# Patient Record
Sex: Male | Born: 1980 | Race: Black or African American | Hispanic: No | Marital: Single | Smoking: Never smoker
Health system: Southern US, Community
[De-identification: ages and names within clinical notes are randomized; demographics above are authoritative.]

---

## 2016-10-13 ENCOUNTER — Emergency Department (HOSPITAL_COMMUNITY)
Admission: EM | Admit: 2016-10-13 | Discharge: 2016-10-14 | Disposition: A | Discharge status: Home / Self Care | Payer: Worker's Compensation | Attending: Emergency Medicine | Admitting: Emergency Medicine

## 2016-10-13 ENCOUNTER — Emergency Department (HOSPITAL_COMMUNITY): Payer: Worker's Compensation

## 2016-10-13 ENCOUNTER — Encounter (HOSPITAL_COMMUNITY): Payer: Self-pay | Admitting: Emergency Medicine

## 2016-10-13 DIAGNOSIS — W208XXA Other cause of strike by thrown, projected or falling object, initial encounter: Secondary | ICD-10-CM | POA: Diagnosis not present

## 2016-10-13 DIAGNOSIS — Y9389 Activity, other specified: Secondary | ICD-10-CM | POA: Diagnosis not present

## 2016-10-13 DIAGNOSIS — S0990XA Unspecified injury of head, initial encounter: Secondary | ICD-10-CM | POA: Diagnosis not present

## 2016-10-13 DIAGNOSIS — Y9289 Other specified places as the place of occurrence of the external cause: Secondary | ICD-10-CM | POA: Insufficient documentation

## 2016-10-13 DIAGNOSIS — Y990 Civilian activity done for income or pay: Secondary | ICD-10-CM | POA: Diagnosis not present

## 2016-10-13 DIAGNOSIS — M542 Cervicalgia: Secondary | ICD-10-CM | POA: Insufficient documentation

## 2016-10-13 NOTE — ED Triage Notes (Signed)
Pt. reported that a box fell on his head while at work this afternoon , denies LOC , reports pain at left lateral neck / headache , pain increases with movement / changing positions . Alert and oriented / respirations unlabored. C- collar applied at triage .

## 2016-10-13 NOTE — ED Notes (Signed)
Patient transported to CT 

## 2016-10-14 ENCOUNTER — Emergency Department (HOSPITAL_COMMUNITY): Payer: Worker's Compensation

## 2016-10-14 MED ORDER — NAPROXEN 500 MG PO TABS: 500.0000 mg | ORAL_TABLET | Freq: Two times a day (BID) | ORAL | 0 refills | Status: DC

## 2016-10-14 NOTE — ED Provider Notes (Signed)
[facility]-EMERGENCY DEPT Provider Note   CSN: [id number] Arrival date & time: 10/13/16  1825     History   Chief Complaint Chief Complaint  Patient presents with  . Neck Pain  . Headache    HPI Chase Adkins is a 36 y.o. male.  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  He thinks it weighed about 45 pounds.  Suddenly developed left neck pain and headache after the incident.  The symptoms have persisted.  He denies paresthesias, weakness, nausea, vomiting, dizziness.  No prior head injuries.  There are no other known modifying factors.     HPI  History reviewed. No pertinent past medical history.  There are no active problems to display for this patient.   History reviewed. No pertinent surgical history.     Home Medications    Prior to Admission medications   Not on File    Family History No family history on file.  Social History Social History  Substance Use Topics  . Smoking status: Never Smoker  . Smokeless tobacco: Never Used  . Alcohol use Yes     Allergies   Patient has no known allergies.   Review of Systems Review of Systems  All other systems reviewed and are negative.    Physical Exam Updated Vital Signs BP 115/78   Pulse (!) 57   Temp 98.6 F (37 C) (Oral)   Resp 20   Ht 6\' 1"  (1.854 m)   Wt 205 lb (93 kg)   SpO2 100%   BMI 27.05 kg/m   Physical Exam  Constitutional: He is oriented to person, place, and time. He appears well-developed and well-nourished.  HENT:  Head: Normocephalic and atraumatic.  Right Ear: External ear normal.  Left Ear: External ear normal.  Eyes: Conjunctivae and EOM are normal. Pupils are equal, round, and reactive to light.  Neck: Normal range of motion and phonation normal. Neck supple.  Cardiovascular: Normal rate, regular rhythm and normal heart sounds.   Pulmonary/Chest: Effort normal and breath sounds normal. He exhibits no bony tenderness.  Abdominal:  Soft. There is no tenderness.  Musculoskeletal: Normal range of motion.  Neurological: He is alert and oriented to person, place, and time. No cranial nerve deficit or sensory deficit. He exhibits normal muscle tone. Coordination normal.  Skin: Skin is warm, dry and intact.  Psychiatric: He has a normal mood and affect. His behavior is normal. Judgment and thought content normal.  Nursing note and vitals reviewed.    ED Treatments / Results  Labs (all labs ordered are listed, but only abnormal results are displayed) Labs Reviewed - No data to display  EKG  EKG Interpretation None       Radiology Dg Cervical Spine Complete  Result Date: 10/13/2016 CLINICAL DATA:  Box fell on head at work today. EXAM: CERVICAL SPINE - COMPLETE 4+ VIEW COMPARISON:  None. FINDINGS: There is no evidence of cervical spine fracture or prevertebral soft tissue swelling. Straightening of the normal cervical lordosis is evident. No other significant bone abnormalities are identified. IMPRESSION: 1. No evidence for cervical spine fracture. 2. Loss of cervical lordosis. This can be related to patient positioning, muscle spasm or soft tissue injury. Electronically Signed   By: Kennith Center M.D.   On: 10/13/2016 20:33    Procedures Procedures (including critical care time)  Medications Ordered in ED Medications - No data to display   Initial Impression / Assessment and Plan /  ED Course  I have reviewed the triage vital signs and the nursing notes.  Pertinent labs & imaging results that were available during my care of the patient were reviewed by me and considered in my medical decision making (see chart for details).     Medications - No data to display  Patient Vitals for the past 24 hrs:  BP Temp Temp src Pulse Resp SpO2 Height Weight  10/13/16 2345 115/78 - - (!) 57 - 100 % - -  10/13/16 2300 124/81 - - - - - - -  10/13/16 2245 131/88 - - - - - - -  10/13/16 2230 124/83 - - - - - - -  10/13/16  2215 124/81 - - - - - - -  10/13/16 1920 - - - - - - 6\' 1"  (1.854 m) 205 lb (93 kg)  10/13/16 1913 132/81 98.6 F (37 C) Oral 64 20 100 % - -    CT images ordered>> care to Dr Manus Gunningancour to evaluate after images return   Final Clinical Impressions(s) / ED Diagnoses   Final diagnoses:  Closed head injury, initial encounter   Head and neck injuries after object fell onto him. Doubt spinal myeolpathy.  Nursing Notes Reviewed/ Care Coordinated Applicable Imaging Reviewed Interpretation of Laboratory Data incorporated into ED treatment  Plan- D/C after images  New Prescriptions New Prescriptions   No medications on file     Mancel BaleElliott Cartrell Bentsen, MD 10/14/16 1006

## 2016-10-14 NOTE — Discharge Instructions (Signed)
Take the antiinflammatories as prescribed. Followup with your doctor. Return to the ED if you develop new or worsening symptoms.

## 2016-10-14 NOTE — ED Provider Notes (Signed)
Care assumed from Dr. Effie ShyWentz. Patient hit in head by box causing his head and neck to turn to the left. No focal weakness, numbness or tingling. CT pending.  CT negative for fracture or significant injury. Neurovascularly intact. Will discharge with antiinflammatories. PCP followup. Return precautions discussed.   Chase OctaveStephen Dixie Jafri, MD 10/14/16 [phone]

## 2018-07-04 IMAGING — CT CT CERVICAL SPINE W/O CM
4 of 7 series · 14 of 33 positions shown, 16 images · non-contrast
Comparison: None.

CLINICAL DATA: Heavy box fell and hit patient in head. Headache and
left-sided posterior neck pain. Cannot turn head to the left.
Initial encounter.

EXAM:
CT HEAD WITHOUT CONTRAST
CT CERVICAL SPINE WITHOUT CONTRAST
TECHNIQUE: Multidetector CT imaging of the head and cervical spine was
performed following the standard protocol without intravenous
contrast. Multiplanar CT image reconstructions of the cervical spine
were also generated.

[Series 202: head w/o bone, idose (1) · axial · non-contrast · 0.43mm/px · z∈[+322,+379]mm · 2 of 70 slices shown]
[im 24/70  bone]
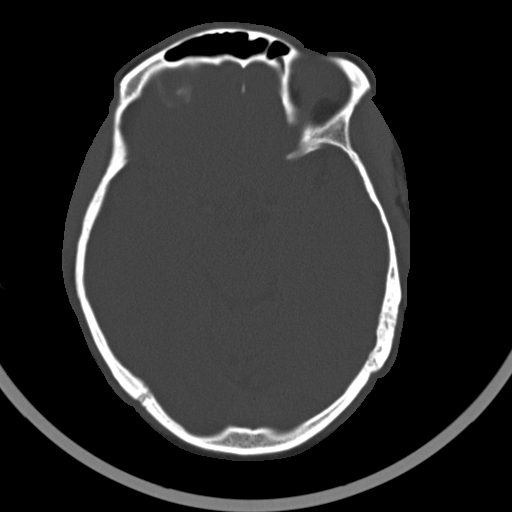
[im 47/70  bone]
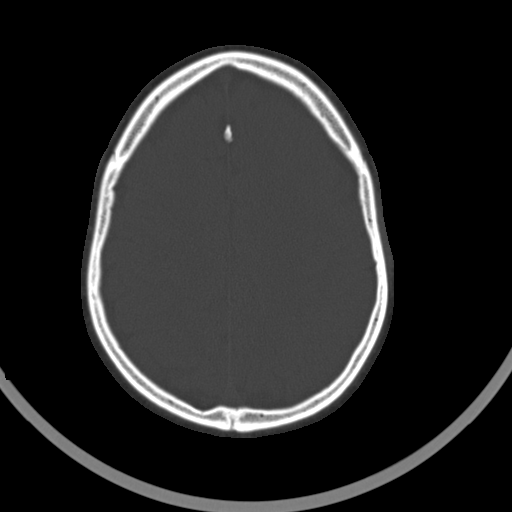

[Series 203: coronal st, idose (1) · coronal · 0.41mm/px · 1 of 71 slices shown]
[im 36/71  bone]
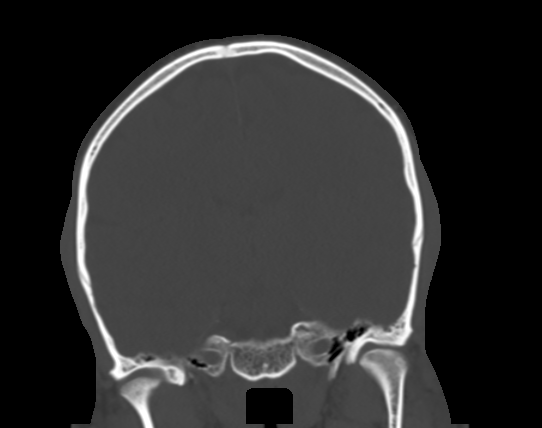

[Series 302: soft tissue, idose (2) · axial · 0.35mm/px · z∈[+101,+281]mm · 6 of 126 slices shown, 8 images]
[im 18/126  soft-tissue]
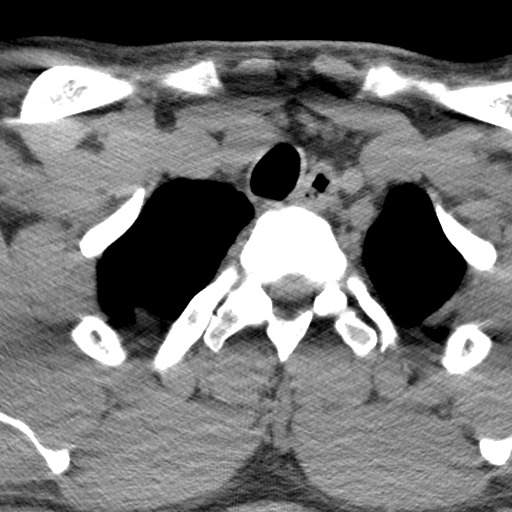
[im 18/126  bone]
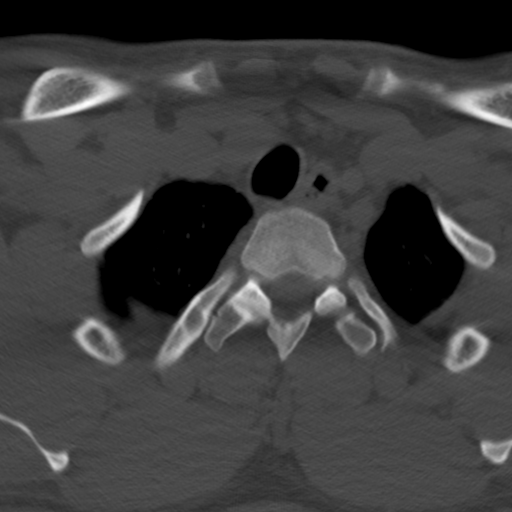
[im 36/126  bone]
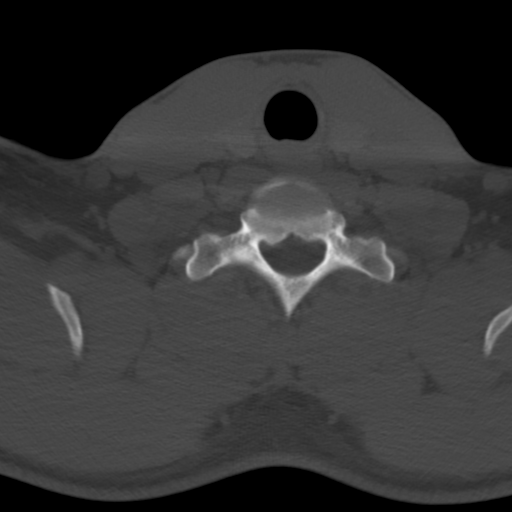
[im 54/126  bone]
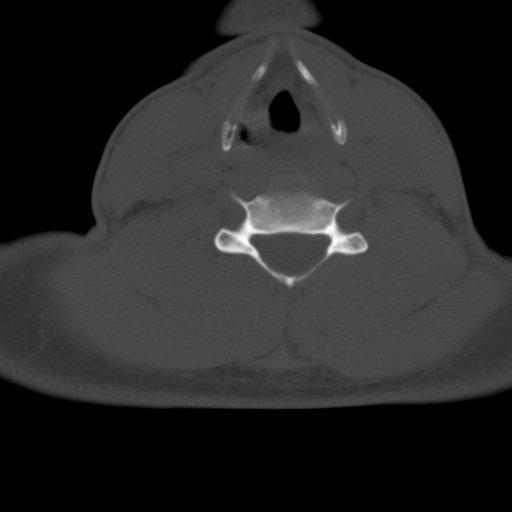
[im 72/126  bone]
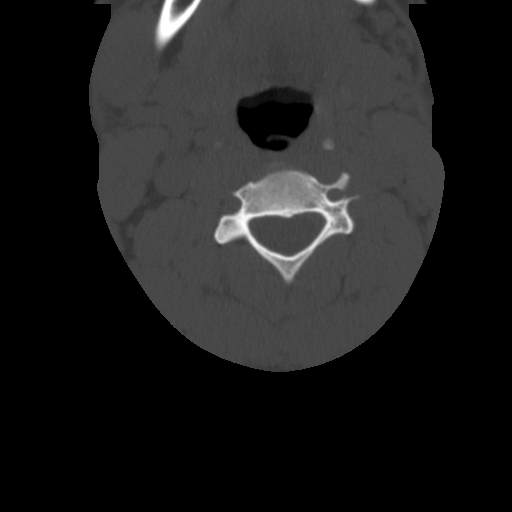
[im 90/126  soft-tissue]
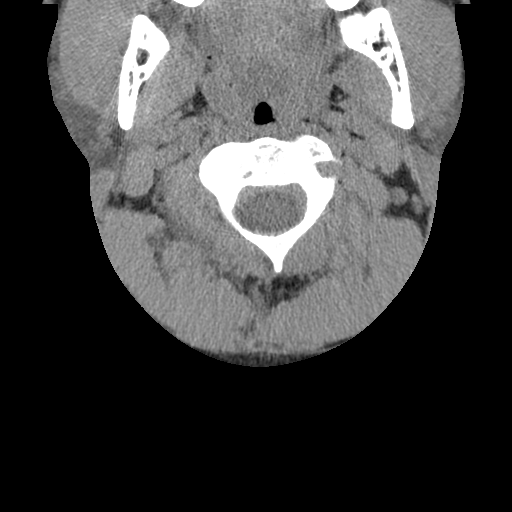
[im 90/126  bone]
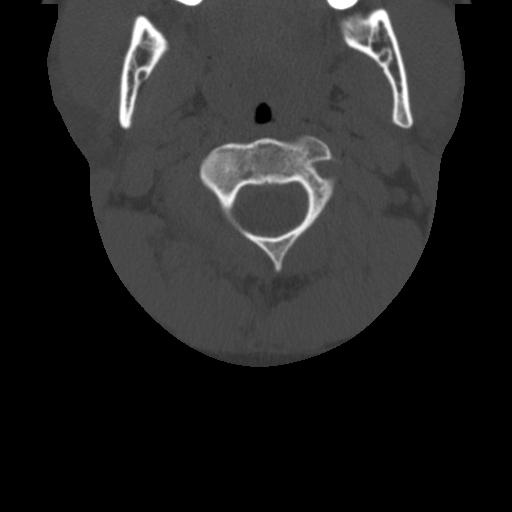
[im 108/126  bone]
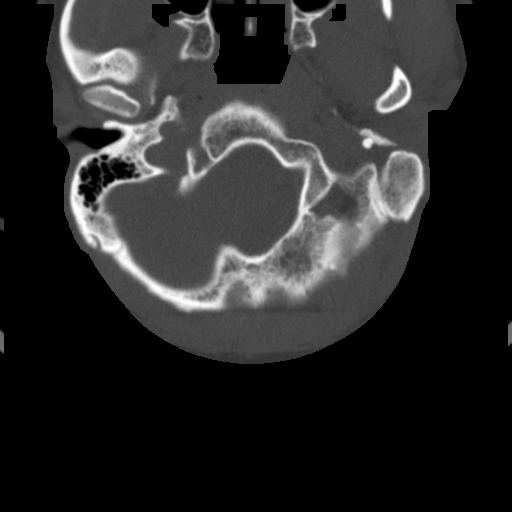

[Series 305: sagittal, idose (2) · sagittal · 0.34mm/px · 5 of 90 slices shown]
[im 15/90  bone]
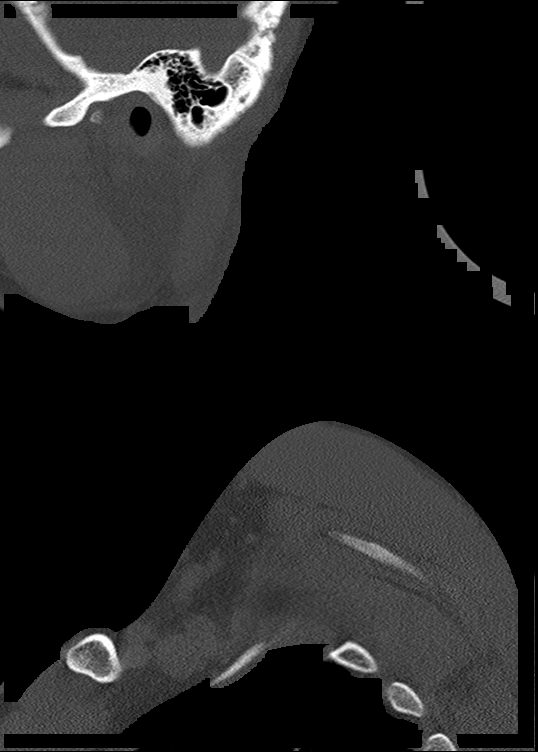
[im 30/90  bone]
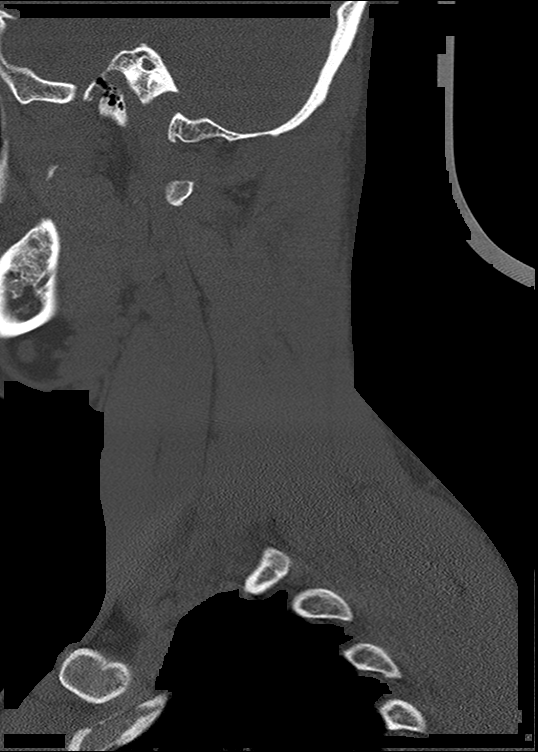
[im 45/90  bone]
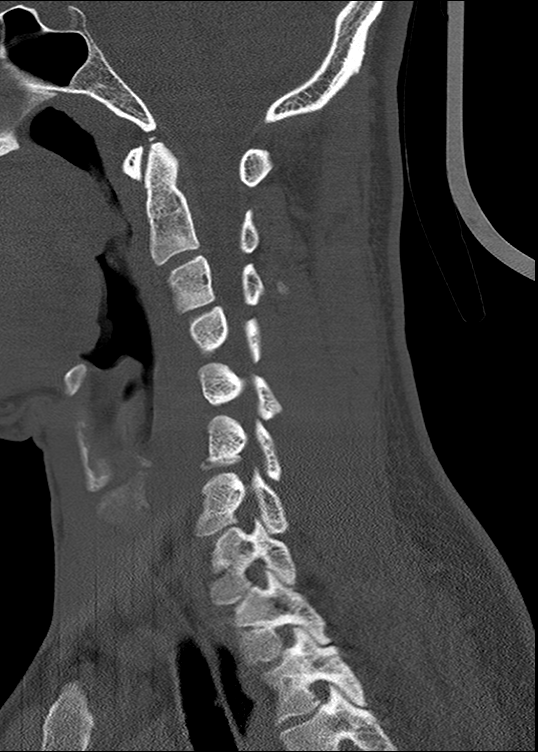
[im 60/90  bone]
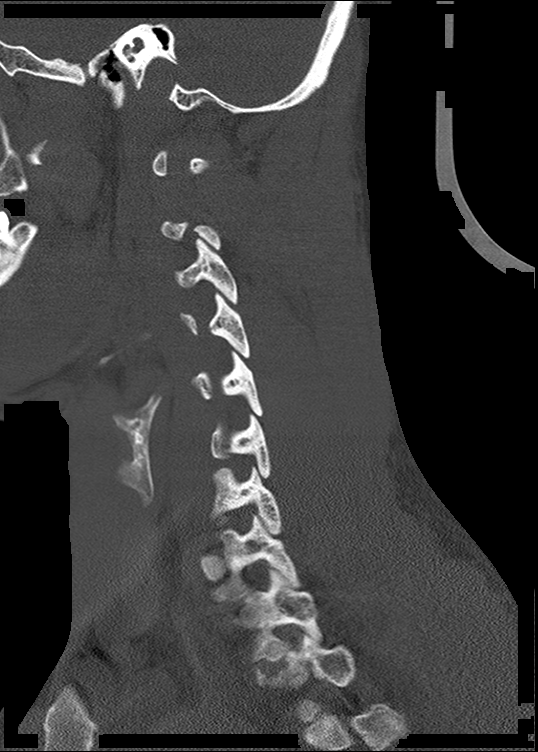
[im 75/90  bone]
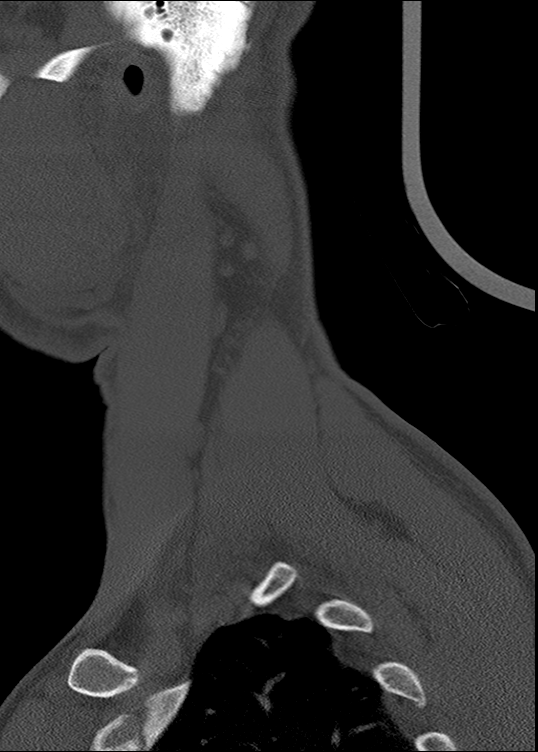

[14 of 33 positions shown; findings below may reference images not displayed]

FINDINGS: CT HEAD FINDINGS

Brain: No evidence of acute infarction, hemorrhage, hydrocephalus,
extra-axial collection or mass lesion/mass effect.

The posterior fossa, including the cerebellum, brainstem and fourth
ventricle, is within normal limits. The third and lateral
ventricles, and basal ganglia are unremarkable in appearance. The
cerebral hemispheres are symmetric in appearance, with normal
gray-white differentiation. No mass effect or midline shift is seen.

Vascular: No hyperdense vessel or unexpected calcification.

Skull: There is no evidence of fracture; visualized osseous
structures are unremarkable in appearance.

Sinuses/Orbits: The orbits are within normal limits. Mucosal
thickening is noted at the left maxillary sinus. The remaining
paranasal sinuses and mastoid air cells are well-aerated.

Other: No significant soft tissue abnormalities are seen.

CT CERVICAL SPINE FINDINGS

Alignment: Normal.

Skull base and vertebrae: No acute fracture. No primary bone lesion
or focal pathologic process.

Soft tissues and spinal canal: No prevertebral fluid or swelling. No
visible canal hematoma.

Disc levels: Intervertebral disc spaces are preserved. The bony
foramina are grossly unremarkable.

Upper chest: The visualized portions of the lung apices are clear.
The thyroid gland is unremarkable in appearance.

Other: No additional soft tissue abnormalities are seen.
IMPRESSION: 1. No evidence of traumatic intracranial injury or fracture.
2. No evidence of fracture or subluxation along the cervical spine.
3. Mucosal thickening at the left maxillary sinus.

## 2022-05-02 DIAGNOSIS — H01009 Unspecified blepharitis unspecified eye, unspecified eyelid: Secondary | ICD-10-CM | POA: Diagnosis not present

## 2022-05-02 DIAGNOSIS — H40013 Open angle with borderline findings, low risk, bilateral: Secondary | ICD-10-CM | POA: Diagnosis not present

## 2022-05-02 DIAGNOSIS — H04123 Dry eye syndrome of bilateral lacrimal glands: Secondary | ICD-10-CM | POA: Diagnosis not present

## 2022-05-02 DIAGNOSIS — H53483 Generalized contraction of visual field, bilateral: Secondary | ICD-10-CM | POA: Diagnosis not present

## 2022-05-05 DIAGNOSIS — Z131 Encounter for screening for diabetes mellitus: Secondary | ICD-10-CM | POA: Diagnosis not present

## 2022-05-05 DIAGNOSIS — Z113 Encounter for screening for infections with a predominantly sexual mode of transmission: Secondary | ICD-10-CM | POA: Diagnosis not present

## 2022-05-05 DIAGNOSIS — Z0000 Encounter for general adult medical examination without abnormal findings: Secondary | ICD-10-CM | POA: Diagnosis not present

## 2022-05-05 DIAGNOSIS — Z23 Encounter for immunization: Secondary | ICD-10-CM | POA: Diagnosis not present

## 2022-05-05 DIAGNOSIS — R3989 Other symptoms and signs involving the genitourinary system: Secondary | ICD-10-CM | POA: Diagnosis not present

## 2022-07-30 DIAGNOSIS — S39012A Strain of muscle, fascia and tendon of lower back, initial encounter: Secondary | ICD-10-CM | POA: Diagnosis not present

## 2023-11-04 ENCOUNTER — Encounter: Payer: Self-pay | Admitting: Family Medicine

## 2023-11-04 ENCOUNTER — Ambulatory Visit (INDEPENDENT_AMBULATORY_CARE_PROVIDER_SITE_OTHER): Payer: Self-pay | Admitting: Family Medicine

## 2023-11-04 VITALS — BP 124/76 | HR 64 | Temp 98.0°F | Ht 73.25 in | Wt 228.2 lb

## 2023-11-04 DIAGNOSIS — R350 Frequency of micturition: Secondary | ICD-10-CM | POA: Diagnosis not present

## 2023-11-04 DIAGNOSIS — Z13220 Encounter for screening for lipoid disorders: Secondary | ICD-10-CM

## 2023-11-04 DIAGNOSIS — Z0000 Encounter for general adult medical examination without abnormal findings: Secondary | ICD-10-CM | POA: Diagnosis not present

## 2023-11-04 NOTE — Assessment & Plan Note (Signed)
 Overall health is excellent. Recommend regular exercise. Discussed recommended screenings and immunizations.

## 2023-11-04 NOTE — Progress Notes (Signed)
 [facility] [address] [place] [address] Dept: [phone] Dept Fax: [phone]  New Patient Office Visit  Subjective:    Patient ID: Rudell Cobb, male    DOB: 11/17/1980, 43 y.o..   MRN: [id number]  Chief Complaint  Patient presents with   Establish Care    NP- establish care.   No concerns.     History of Present Illness:  Patient is in today to establish care. Mr. Derenda Fennel was born [place]. He grew up in [place]. He attended university there, majoring in Technical sales engineer. He immigrated tot he [place] in 2013. He had more recently been living in [place], prior to moving to [place]. 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 He is single and has no children. Mr. Fiero smokes a hookah and drinks alcohol socially. He denies any drug use.  Review of Systems  Constitutional:  Negative for chills, diaphoresis, fever, malaise/fatigue and weight loss.  HENT:  Positive for sore throat. Negative for congestion, ear pain, hearing loss, sinus pain and tinnitus.        Occasional mucous drainage in throat. He clears this with gargling warm water.  Eyes:  Negative for blurred vision, pain, discharge and redness.  Respiratory:  Negative for cough, shortness of breath and wheezing.   Cardiovascular:  Negative for chest pain and palpitations.  Gastrointestinal:  Negative for abdominal pain, constipation, diarrhea, heartburn, nausea and vomiting.  Genitourinary:  Positive for frequency.       Notes he urinates 1-3 times a night. His urine does look darker yellow at times.  Musculoskeletal:  Negative for back pain, joint pain and myalgias.  Skin:  Negative for itching and rash.  Psychiatric/Behavioral:  Negative for depression. The patient is not nervous/anxious.    Past Medical History: There are no active problems to display for this patient.  History reviewed. No pertinent  surgical history. Family History  Problem Relation Age of Onset   Heart disease Mother    Lupus Mother    Hypertension Father    Outpatient Medications Prior to Visit  Medication Sig Dispense Refill   naproxen (NAPROSYN) 500 MG tablet Take 1 tablet (500 mg total) by mouth 2 (two) times daily. 30 tablet 0   No facility-administered medications prior to visit.   No Known Allergies Objective:   Today's Vitals   11/04/23 1417  BP: 124/76  Pulse: 64  Temp: 98 F (36.7 C)  TempSrc: Temporal  SpO2: 97%  Weight: 228 lb 3.2 oz (103.5 kg)  Height: 6' 1.25" (1.861 m)   Body mass index is 29.9 kg/m.   General: Well developed, well nourished. No acute distress. HEENT: Normocephalic, non-traumatic. PERRL, EOMI. Conjunctiva clear. External ears normal. EAC and   TMs normal bilaterally. Nose clear without congestion or rhinorrhea. Mucous membranes moist.   Oropharynx clear. Good dentition. Neck: Supple. No lymphadenopathy. No thyromegaly. Lungs: Clear to auscultation bilaterally. No wheezing, rales or rhonchi. CV: RRR without murmurs or rubs. Pulses 2+ bilaterally. Abdomen: Soft, non-tender. Bowel sounds positive, normal pitch and frequency. No hepatosplenomegaly.   No rebound or guarding. Extremities: Full ROM. No joint swelling or tenderness. No edema noted. Skin: Warm and dry. No rashes. Psych: Alert and oriented. Normal mood and affect.  Health Maintenance Due  Topic Date Due   HIV Screening  Never done   Hepatitis C Screening  Never done     Assessment & Plan:   Problem List  Items Addressed This Visit       Other   Annual physical exam - Primary   Overall health is excellent. Recommend regular exercise. Discussed recommended screenings and immunizations.       Other Visit Diagnoses       Screening for lipid disorders       Relevant Orders   Lipid panel     Urinary frequency       Relevant Orders   Basic metabolic panel with GFR   Urinalysis w microscopic +  reflex cultur       Return in about 1 year (around 11/03/2024) for Annual preventative care.   Graig Lawyer, MD

## 2023-11-05 ENCOUNTER — Encounter: Payer: Self-pay | Admitting: Family Medicine

## 2023-11-05 LAB — URINALYSIS W MICROSCOPIC + REFLEX CULTURE
Bacteria, UA: NONE SEEN /HPF
Bilirubin Urine: NEGATIVE
Glucose, UA: NEGATIVE
Hgb urine dipstick: NEGATIVE
Hyaline Cast: NONE SEEN /LPF
Ketones, ur: NEGATIVE
Leukocyte Esterase: NEGATIVE
Nitrites, Initial: NEGATIVE
Protein, ur: NEGATIVE
Specific Gravity, Urine: 1.026 (ref 1.001–1.035)
Squamous Epithelial / HPF: NONE SEEN /HPF (ref <=5)
WBC, UA: NONE SEEN /HPF (ref 0–5)
pH: 7.5 (ref 5.0–8.0)

## 2023-11-05 LAB — LIPID PANEL
Cholesterol: 182 mg/dL (ref 0–200)
HDL: 41.5 mg/dL (ref >39.00)
LDL Cholesterol: 127 mg/dL — ABNORMAL HIGH (ref 0–99)
NonHDL: 140.95
Total CHOL/HDL Ratio: 4
Triglycerides: 70 mg/dL (ref 0.0–149.0)
VLDL: 14 mg/dL (ref 0.0–40.0)

## 2023-11-05 LAB — BASIC METABOLIC PANEL WITH GFR
BUN: 11 mg/dL (ref 6–23)
CO2: 27 meq/L (ref 19–32)
Calcium: 9.3 mg/dL (ref 8.4–10.5)
Chloride: 102 meq/L (ref 96–112)
Creatinine, Ser: 1.13 mg/dL (ref 0.40–1.50)
GFR: 80.97 mL/min (ref >60.00)
Glucose, Bld: 80 mg/dL (ref 70–99)
Potassium: 4.1 meq/L (ref 3.5–5.1)
Sodium: 136 meq/L (ref 135–145)

## 2023-11-05 LAB — NO CULTURE INDICATED
